# Patient Record
Sex: Male | Born: 1980 | Hispanic: Yes | Marital: Single | Smoking: Never smoker
Health system: Southern US, Community
[De-identification: ages and names within clinical notes are randomized; demographics above are authoritative.]

---

## 2016-03-06 ENCOUNTER — Encounter: Payer: Self-pay | Admitting: Emergency Medicine

## 2016-03-06 ENCOUNTER — Emergency Department: Payer: Self-pay

## 2016-03-06 ENCOUNTER — Emergency Department
Admission: EM | Admit: 2016-03-06 | Discharge: 2016-03-06 | Disposition: A | Discharge status: Home / Self Care | Payer: Self-pay | Attending: Emergency Medicine | Admitting: Emergency Medicine

## 2016-03-06 DIAGNOSIS — R0789 Other chest pain: Secondary | ICD-10-CM | POA: Insufficient documentation

## 2016-03-06 LAB — CBC
HEMATOCRIT: 42.1 % (ref 40.0–52.0)
Hemoglobin: 14.4 g/dL (ref 13.0–18.0)
MCH: 31.2 pg (ref 26.0–34.0)
MCHC: 34.1 g/dL (ref 32.0–36.0)
MCV: 91.6 fL (ref 80.0–100.0)
PLATELETS: 464 10*3/uL — AB (ref 150–440)
RBC: 4.6 MIL/uL (ref 4.40–5.90)
RDW: 13.2 % (ref 11.5–14.5)
WBC: 13.9 10*3/uL — AB (ref 3.8–10.6)

## 2016-03-06 LAB — BASIC METABOLIC PANEL
Anion gap: 8 (ref 5–15)
BUN: 14 mg/dL (ref 6–20)
CHLORIDE: 106 mmol/L (ref 101–111)
CO2: 24 mmol/L (ref 22–32)
CREATININE: 0.78 mg/dL (ref 0.61–1.24)
Calcium: 8.8 mg/dL — ABNORMAL LOW (ref 8.9–10.3)
Glucose, Bld: 127 mg/dL — ABNORMAL HIGH (ref 65–99)
POTASSIUM: 3.3 mmol/L — AB (ref 3.5–5.1)
SODIUM: 138 mmol/L (ref 135–145)

## 2016-03-06 LAB — FIBRIN DERIVATIVES D-DIMER (ARMC ONLY): Fibrin derivatives D-dimer (ARMC): 179 (ref 0–499)

## 2016-03-06 LAB — TROPONIN I: Troponin I: <0.03 ng/mL (ref <0.031)

## 2016-03-06 MED ORDER — CYCLOBENZAPRINE HCL 10 MG PO TABS
5.0000 mg | ORAL_TABLET | Freq: Once | ORAL | Status: AC
  Administered 2016-03-06: 5 mg via ORAL
  Filled 2016-03-06: qty 1

## 2016-03-06 MED ORDER — HYDROCODONE-ACETAMINOPHEN 5-325 MG PO TABS
1.0000 | ORAL_TABLET | Freq: Once | ORAL | Status: AC
  Administered 2016-03-06: 1 via ORAL
  Filled 2016-03-06: qty 1

## 2016-03-06 MED ORDER — IBUPROFEN 800 MG PO TABS
800.0000 mg | ORAL_TABLET | Freq: Once | ORAL | Status: AC
  Administered 2016-03-06: 800 mg via ORAL
  Filled 2016-03-06: qty 1

## 2016-03-06 MED ORDER — CYCLOBENZAPRINE HCL 5 MG PO TABS: 5.0000 mg | ORAL_TABLET | Freq: Three times a day (TID) | ORAL | Status: AC | PRN

## 2016-03-06 MED ORDER — IBUPROFEN 600 MG PO TABS: 600.0000 mg | ORAL_TABLET | Freq: Four times a day (QID) | ORAL | Status: DC | PRN

## 2016-03-06 NOTE — Discharge Instructions (Signed)
Take motrin for pain.   Take flexeril for muscle spasms.   Rest for 2 days,.   See your doctor   Return to ER if you have severe chest pain, shortness of breath
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
## 2017-08-20 IMAGING — CR DG CHEST 2V
2 series · 2 of 2 positions shown · non-contrast
Comparison: None.

CLINICAL DATA: Chest pain for 1 week.  No known injury.

EXAM:
CHEST  2 VIEW

[chest pa]
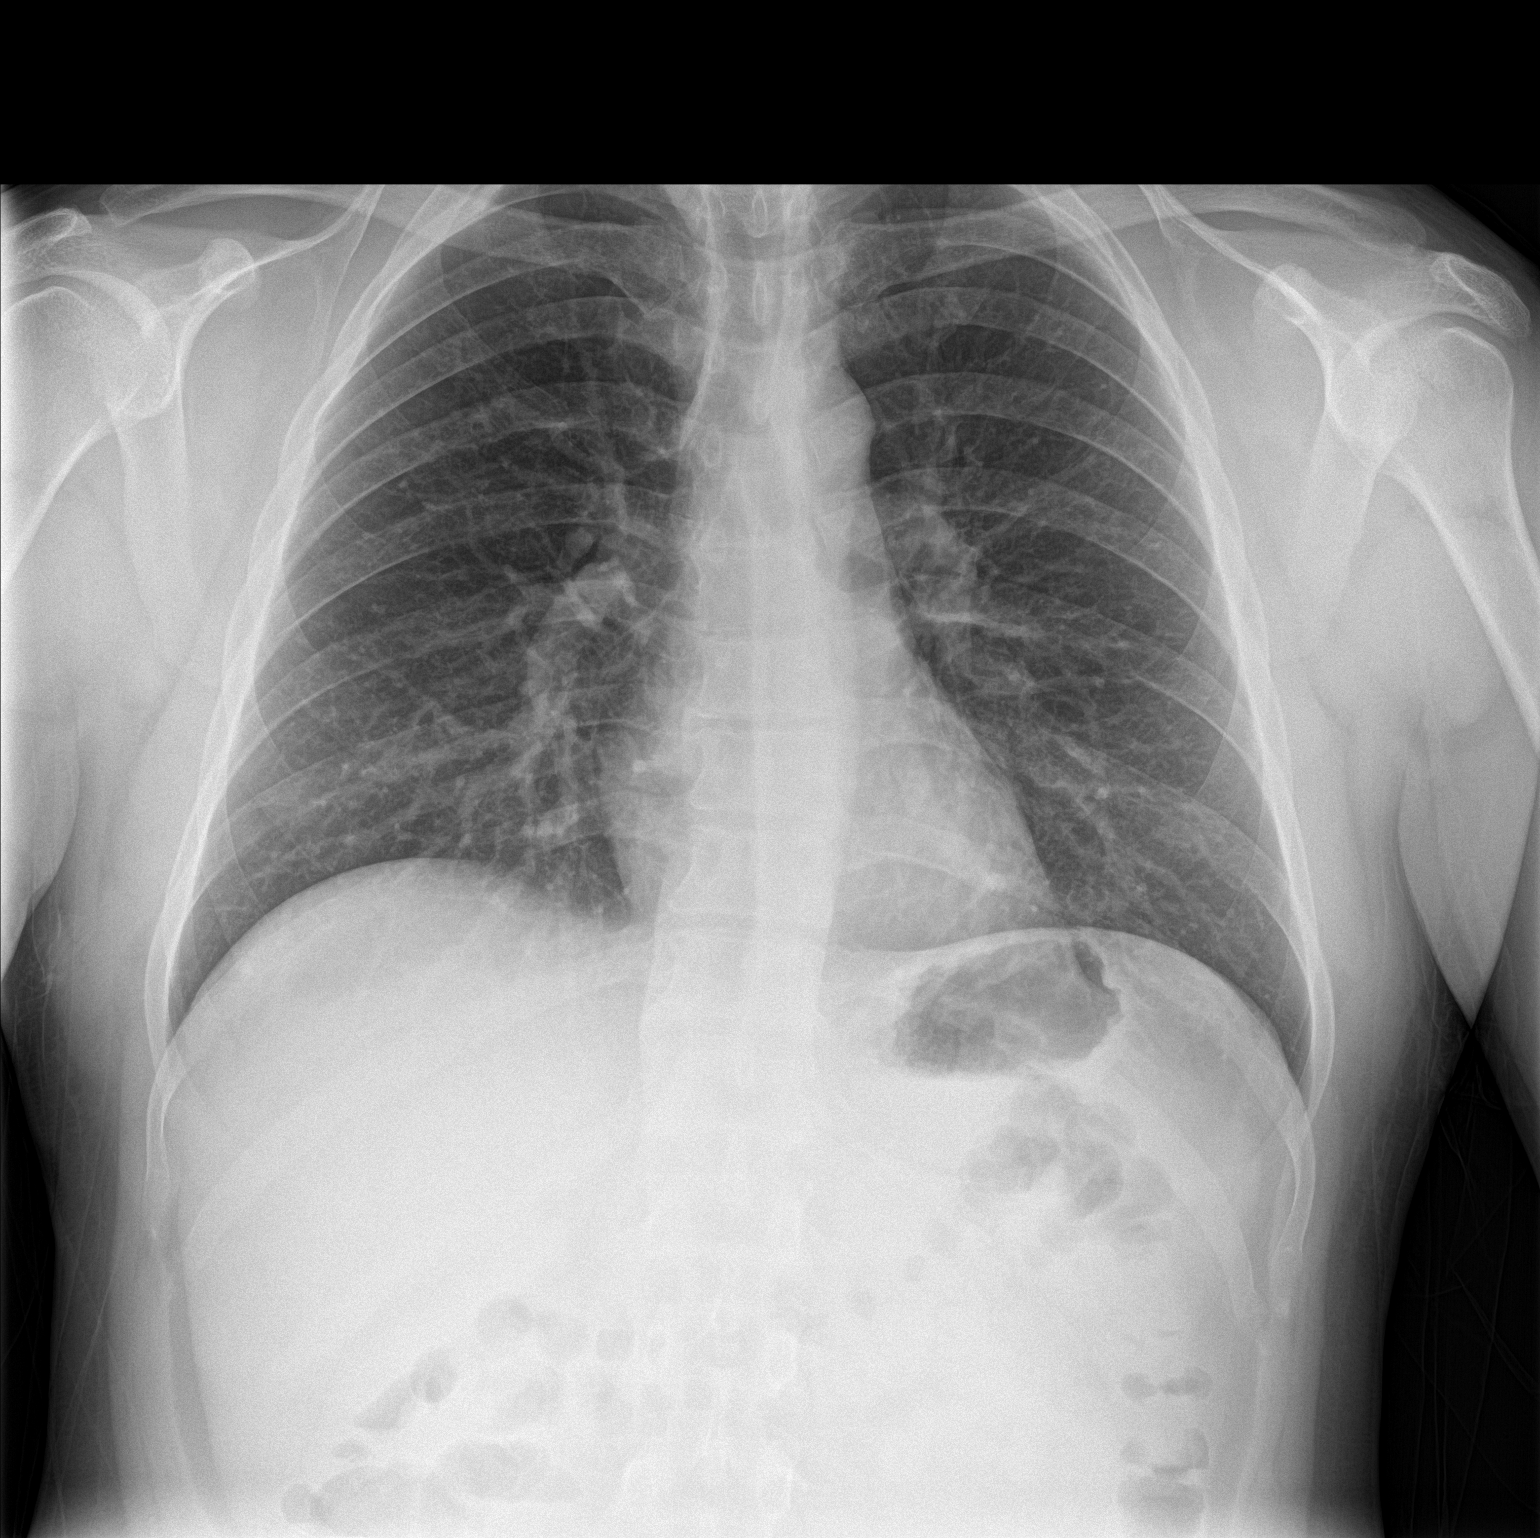

[chest lat]
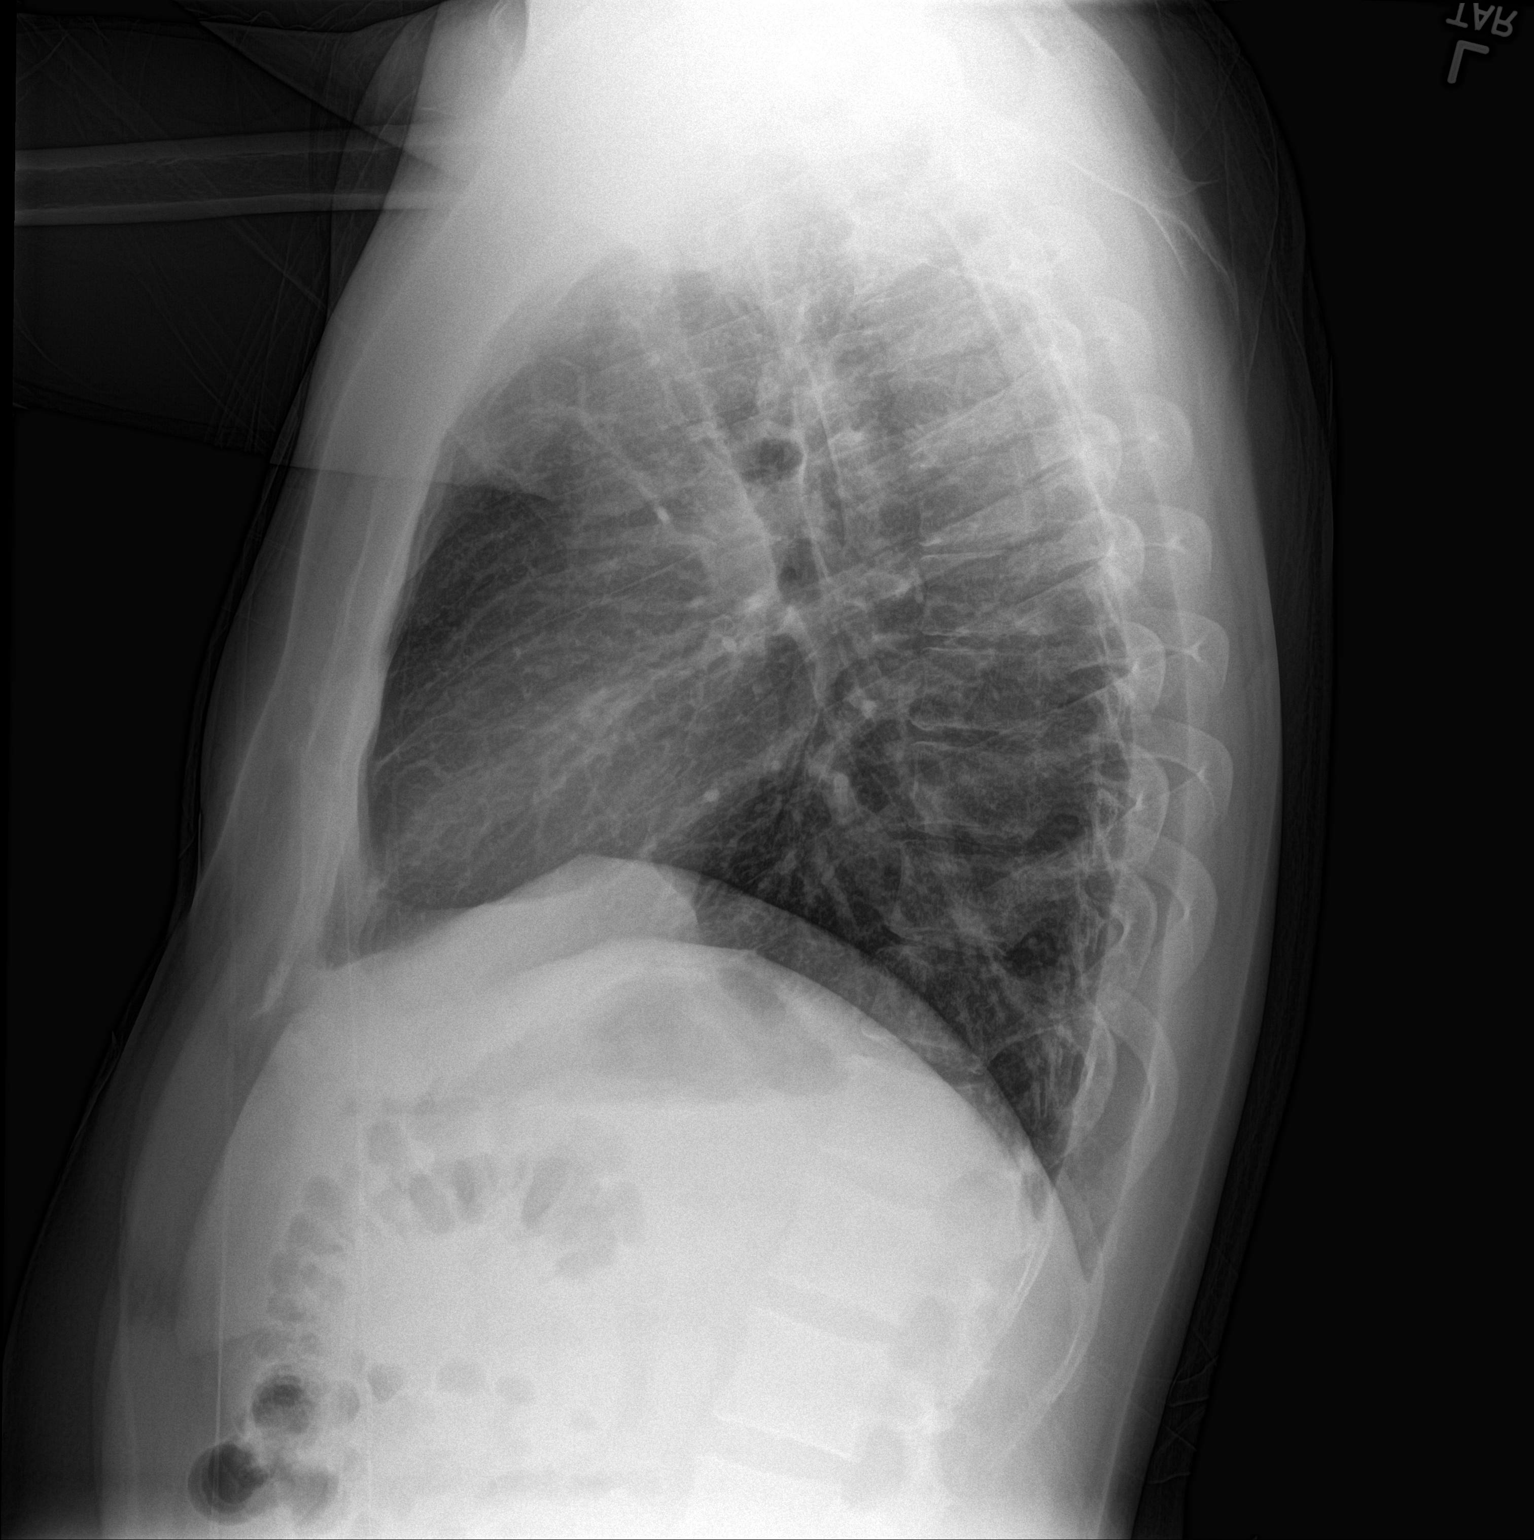

[2 of 2 positions shown; findings below may reference images not displayed]

FINDINGS: The lungs are clear. Heart size is normal. No pneumothorax or
pleural effusion. No focal bony abnormality.
IMPRESSION: Negative chest.

## 2020-08-02 ENCOUNTER — Other Ambulatory Visit: Payer: Self-pay

## 2020-08-02 ENCOUNTER — Emergency Department
Admission: EM | Admit: 2020-08-02 | Discharge: 2020-08-02 | Disposition: A | Discharge status: Home / Self Care | Payer: Self-pay | Attending: Emergency Medicine | Admitting: Emergency Medicine

## 2020-08-02 DIAGNOSIS — Y92410 Unspecified street and highway as the place of occurrence of the external cause: Secondary | ICD-10-CM | POA: Insufficient documentation

## 2020-08-02 DIAGNOSIS — Y9389 Activity, other specified: Secondary | ICD-10-CM | POA: Diagnosis not present

## 2020-08-02 DIAGNOSIS — M7918 Myalgia, other site: Secondary | ICD-10-CM | POA: Insufficient documentation

## 2020-08-02 DIAGNOSIS — Y999 Unspecified external cause status: Secondary | ICD-10-CM | POA: Insufficient documentation

## 2020-08-02 MED ORDER — CYCLOBENZAPRINE HCL 5 MG PO TABS: 5.0000 mg | ORAL_TABLET | Freq: Three times a day (TID) | ORAL | 0 refills | Status: AC | PRN

## 2020-08-02 MED ORDER — IBUPROFEN 800 MG PO TABS: 800.0000 mg | ORAL_TABLET | Freq: Three times a day (TID) | ORAL | 0 refills | Status: AC | PRN
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
